# Patient Record
Sex: Male | Born: 1970 | Hispanic: No | Marital: Married | Smoking: Never smoker
Health system: Southern US, Community
[De-identification: ages and names within clinical notes are randomized; demographics above are authoritative.]

## PROBLEM LIST (undated history)

## (undated) DIAGNOSIS — N3010 Interstitial cystitis (chronic) without hematuria: Secondary | ICD-10-CM

## (undated) DIAGNOSIS — L4050 Arthropathic psoriasis, unspecified: Secondary | ICD-10-CM

## (undated) DIAGNOSIS — R51 Headache: Principal | ICD-10-CM

## (undated) HISTORY — PX: KNEE SURGERY: SHX244

## (undated) HISTORY — DX: Arthropathic psoriasis, unspecified: L40.50

## (undated) HISTORY — DX: Headache: R51

## (undated) HISTORY — DX: Interstitial cystitis (chronic) without hematuria: N30.10

---
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
## 1996-01-25 HISTORY — PX: CYSTOSCOPY: SUR368

## 2001-01-24 HISTORY — PX: WISDOM TOOTH EXTRACTION: SHX21

## 2006-11-20 ENCOUNTER — Encounter: Admission: RE | Admit: 2006-11-20 | Discharge: 2006-11-20 | Payer: Self-pay | Admitting: Endocrinology

## 2013-02-01 ENCOUNTER — Other Ambulatory Visit (HOSPITAL_COMMUNITY): Payer: Self-pay | Admitting: Gastroenterology

## 2013-02-01 DIAGNOSIS — R6881 Early satiety: Secondary | ICD-10-CM

## 2013-02-01 DIAGNOSIS — R110 Nausea: Secondary | ICD-10-CM

## 2013-02-08 ENCOUNTER — Encounter (HOSPITAL_COMMUNITY)
Admission: RE | Admit: 2013-02-08 | Discharge: 2013-02-08 | Disposition: A | Discharge status: Home / Self Care | Payer: 59 | Source: Ambulatory Visit | Attending: Gastroenterology | Admitting: Gastroenterology

## 2013-02-08 DIAGNOSIS — R110 Nausea: Secondary | ICD-10-CM | POA: Insufficient documentation

## 2013-02-08 DIAGNOSIS — R6881 Early satiety: Secondary | ICD-10-CM

## 2013-02-08 MED ORDER — TECHNETIUM TC 99M SULFUR COLLOID
2.0000 | Freq: Once | INTRAVENOUS | Status: AC | PRN
  Administered 2013-02-08: 2 via ORAL

## 2013-06-03 ENCOUNTER — Other Ambulatory Visit: Payer: Self-pay | Admitting: Gastroenterology

## 2013-06-03 DIAGNOSIS — R198 Other specified symptoms and signs involving the digestive system and abdomen: Secondary | ICD-10-CM

## 2013-06-03 DIAGNOSIS — R109 Unspecified abdominal pain: Secondary | ICD-10-CM

## 2013-06-05 ENCOUNTER — Other Ambulatory Visit: Payer: 59

## 2013-06-14 ENCOUNTER — Ambulatory Visit
Admission: RE | Admit: 2013-06-14 | Discharge: 2013-06-14 | Disposition: A | Discharge status: Home / Self Care | Payer: 59 | Source: Ambulatory Visit | Attending: Gastroenterology | Admitting: Gastroenterology

## 2013-06-14 ENCOUNTER — Other Ambulatory Visit: Payer: 59

## 2013-06-14 DIAGNOSIS — R198 Other specified symptoms and signs involving the digestive system and abdomen: Secondary | ICD-10-CM

## 2013-06-14 DIAGNOSIS — R109 Unspecified abdominal pain: Secondary | ICD-10-CM

## 2013-06-14 MED ORDER — IOHEXOL 300 MG/ML  SOLN
125.0000 mL | Freq: Once | INTRAMUSCULAR | Status: AC | PRN
  Administered 2013-06-14: 125 mL via INTRAVENOUS

## 2013-08-14 ENCOUNTER — Other Ambulatory Visit: Payer: Self-pay | Admitting: Family Medicine

## 2013-08-14 DIAGNOSIS — G44029 Chronic cluster headache, not intractable: Secondary | ICD-10-CM

## 2013-08-23 ENCOUNTER — Other Ambulatory Visit: Payer: 59

## 2013-08-30 ENCOUNTER — Ambulatory Visit
Admission: RE | Admit: 2013-08-30 | Discharge: 2013-08-30 | Disposition: A | Discharge status: Home / Self Care | Payer: 59 | Source: Ambulatory Visit | Attending: Family Medicine | Admitting: Family Medicine

## 2013-08-30 DIAGNOSIS — G44029 Chronic cluster headache, not intractable: Secondary | ICD-10-CM

## 2013-09-25 ENCOUNTER — Encounter: Payer: Self-pay | Admitting: Neurology

## 2013-09-25 ENCOUNTER — Ambulatory Visit (INDEPENDENT_AMBULATORY_CARE_PROVIDER_SITE_OTHER): Payer: 59 | Admitting: Neurology

## 2013-09-25 VITALS — BP 110/78 | HR 64 | Ht 73.0 in | Wt 191.0 lb

## 2013-09-25 DIAGNOSIS — K219 Gastro-esophageal reflux disease without esophagitis: Secondary | ICD-10-CM

## 2013-09-25 DIAGNOSIS — R51 Headache: Secondary | ICD-10-CM

## 2013-09-25 DIAGNOSIS — R519 Headache, unspecified: Secondary | ICD-10-CM | POA: Insufficient documentation

## 2013-09-25 HISTORY — DX: Headache: R51

## 2013-09-25 MED ORDER — VENLAFAXINE HCL ER 37.5 MG PO CP24: ORAL_CAPSULE | ORAL | Status: DC

## 2013-09-25 NOTE — Progress Notes (Signed)
Reason for visit: Headache  JESSIE SCHRIEBER is a 43 y.o. male  History of present illness:  Mr. Spillers is a 42 year old right-handed white male with a history of psoriasis and a psoriatic arthritis syndrome. The patient has been on Enbrel until about 10 days ago, and he was taken off the medication. This did significantly help his psoriatic arthritis. Beginning around March or April 2015, the patient developed a headache which was unusual for him. The patient indicates that prior to the onset of the headache, he will had traveled to [place], and during that trip he developed a viral illness with nausea and vomiting, fevers, and diarrhea that lasted 2 days and then fully resolved. The patient has not had any further symptoms in this regard. The patient has had some issues with a distortion of taste in the mouth, excessive saliva, and hoarseness of the voice. He has been seen through gastroenterology, but no etiology of his symptoms were noted. The patient has had daily headaches over the last several months, and the headaches are bifrontal in nature, may spread to the occipital area. The patient may have sharp jabbing pains behind eyes with pain in the eyelids as well. The headaches are daily in nature, better in the morning, and generally get worse around 5 or 6 PM when he gets home from work. The patient denies any visual blurring or loss of vision. He has been seen by an ophthalmologist and the eye examination was unremarkable. The patient does have some occasional neck stiffness and discomfort with the headache. The patient does not usually sleep well at night, but this is a chronic issue. He reports no numbness or weakness of the face, arms, or legs. He denies any photophobia or phonophobia with the headache and he has no nausea or vomiting with the headache. He has undergone a CT scan of the sinuses that were relatively unremarkable, but he was given a trial on clindamycin without benefit and a  nasal spray without benefit with the headache. The patient is not actively taking any medications for the headache when he headache comes on. The headaches are not disabling. He is sent to this office for an evaluation.  Past Medical History  Diagnosis Date  . Psoriatic arthritis   . Headache(784.0) 09/25/2013    Past Surgical History  Procedure Laterality Date  . Knee surgery Bilateral     Arthroscopic  . Arm surgery  1979  . Wisdom tooth extraction    . Orif arm fracture Left     Family History  Problem Relation Age of Onset  . Migraines Neg Hx     Social history:  reports that he has never smoked. He has never used smokeless tobacco. He reports that he drinks alcohol. He reports that he does not use illicit drugs.  Medications:  No current outpatient prescriptions on file prior to visit.   No current facility-administered medications on file prior to visit.     No Known Allergies  ROS:  Out of a complete 14 system review of symptoms, the patient complains only of the following symptoms, and all other reviewed systems are negative.  Eye pain Constipation Headache  Blood pressure 110/78, pulse 64, height  (1.854 m), weight 191 lb (86.637 kg).  Physical Exam  General: The patient is alert and cooperative at the time of the examination.  Eyes: Pupils are equal, round, and reactive to light. Discs are flat bilaterally.  Neck: The neck is supple, no carotid  bruits are noted.  Respiratory: The respiratory examination is clear.  Cardiovascular: The cardiovascular examination reveals a regular rate and rhythm, no obvious murmurs or rubs are noted.  Neuromuscular: Range of movement of the cervical spine is full. No crepitus is noted with the temporomandibular joints.  Skin: Extremities are without significant edema.  Neurologic Exam  Mental status: The patient is alert and oriented x 3 at the time of the examination. The patient has apparent normal recent and  remote memory, with an apparently normal attention span and concentration ability.  Cranial nerves: Facial symmetry is present. There is good sensation of the face to pinprick and soft touch bilaterally. The strength of the facial muscles and the muscles to head turning and shoulder shrug are normal bilaterally. Speech is well enunciated, no aphasia or dysarthria is noted. Extraocular movements are full. Visual fields are full. The tongue is midline, and the patient has symmetric elevation of the soft palate. No obvious hearing deficits are noted.  Motor: The motor testing reveals 5 over 5 strength of all 4 extremities. Good symmetric motor tone is noted throughout.  Sensory: Sensory testing is intact to pinprick, soft touch, vibration sensation, and position sense on all 4 extremities. No evidence of extinction is noted.  Coordination: Cerebellar testing reveals good finger-nose-finger and heel-to-shin bilaterally.  Gait and station: Gait is normal. Tandem gait is normal. Romberg is negative. No drift is seen.  Reflexes: Deep tendon reflexes are symmetric and normal bilaterally. Toes are downgoing bilaterally.   Assessment/Plan:  1. Headache, probable tension headache  2. Psoriasis  The patient appears to have a description of a headache syndrome most consistent with a muscle tension headache. The headaches are daily in nature, better in the morning and worse as the day goes on. The headaches are not disabling, but they are bothersome. The patient has been immunosuppressed with Enbrel, and he has traveled abroad recently. The patient will be set up for MRI evaluation of the brain. The patient requests a GI referral, I will get this set up. He could possibly have reflux symptoms resulting in the intermittent hoarseness of the voice that he reports. The patient was placed on Effexor for the headache. He will followup in 3 months. Clinical examination is normal today.  Marlan Palau  MD 09/25/2013 9:58 AM  [facility] [address] [place], [place] [address]  Phone [phone] Fax [phone]

## 2013-09-25 NOTE — Patient Instructions (Signed)

## 2013-10-02 ENCOUNTER — Ambulatory Visit (INDEPENDENT_AMBULATORY_CARE_PROVIDER_SITE_OTHER): Payer: 59

## 2013-10-02 DIAGNOSIS — R51 Headache: Secondary | ICD-10-CM

## 2013-10-03 ENCOUNTER — Encounter: Payer: Self-pay | Admitting: Internal Medicine

## 2013-10-03 ENCOUNTER — Telehealth: Payer: Self-pay | Admitting: Neurology

## 2013-10-03 NOTE — Telephone Encounter (Signed)
  I called patient. The MRI the brain is unremarkable. The patient has had some improvement in his headaches as his constipation issue has alleviated. He will go up on the Effexor taking 75 mg daily.  MRI brain 10/03/2013:  Impression   Normal MRI brain (without). No change from MRI on 08/30/13.

## 2013-11-14 ENCOUNTER — Telehealth: Payer: Self-pay | Admitting: Internal Medicine

## 2013-11-14 NOTE — Telephone Encounter (Signed)
Rec'd from [facility] forward 26 pages to Dr. Leone PayorGessner

## 2013-12-02 ENCOUNTER — Ambulatory Visit: Payer: 59 | Admitting: Internal Medicine

## 2013-12-03 ENCOUNTER — Encounter: Payer: Self-pay | Admitting: Internal Medicine

## 2014-01-21 ENCOUNTER — Telehealth: Payer: Self-pay | Admitting: Internal Medicine

## 2014-01-21 NOTE — Telephone Encounter (Signed)
[facility] and placed on Dr. Marvell FullerGessner's Desk to be reviewed. Called pt and made him aware we canceled his appt on 02-03-14 until records are reviewed

## 2014-02-03 ENCOUNTER — Ambulatory Visit: Payer: 59 | Admitting: Internal Medicine

## 2014-02-03 ENCOUNTER — Telehealth: Payer: Self-pay | Admitting: Neurology

## 2014-02-03 NOTE — Telephone Encounter (Signed)
Left message relaying that I need to cancel his appointment for 02-05-14 due to provider not being available.  I asked that he call back to reschedule his follow up appointment.

## 2014-02-04 NOTE — Telephone Encounter (Signed)
Left message on patient's home phone relaying cancellation of appointment on 02-05-14 and asked that he return call to confirm.

## 2014-02-05 ENCOUNTER — Ambulatory Visit: Payer: 59 | Admitting: Neurology

## 2016-02-06 IMAGING — MR MR HEAD W/O CM
9 series · 41 of 48 positions shown · non-contrast
Comparison: None available.

CLINICAL DATA: Cluster headaches, predominantly on the left.

EXAM:
MRI HEAD WITHOUT CONTRAST
TECHNIQUE: Multiplanar, multiecho pulse sequences of the brain and surrounding
structures were obtained without intravenous contrast.

[Series 2: T1 · sagittal · 5.0mm · 0.45mm/px · 2 of 21 slices shown]
[im 1/21]
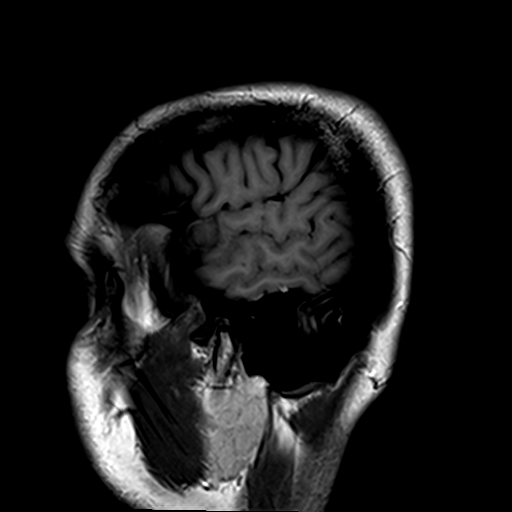
[im 21/21]
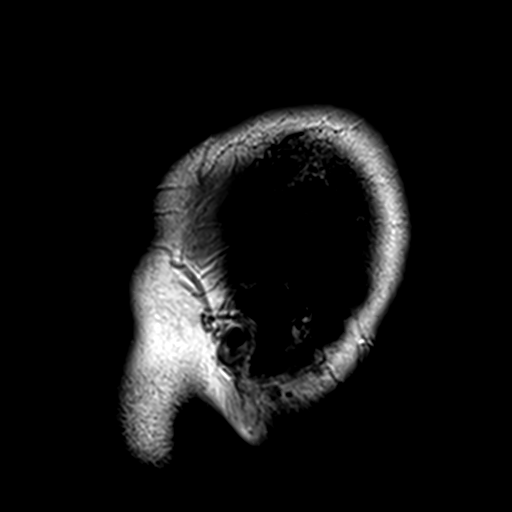

[Series 3: DWI · axial · 5.0mm · 1.80mm/px · z∈[-32,+123]mm · 5 of 48 slices shown (1 of 2)]
[im 1/48]
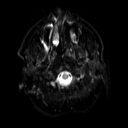
[im 12/48]
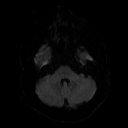
[im 24/48]
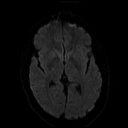
[im 36/48]
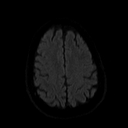
[im 48/48]
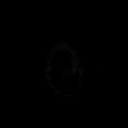

[Series 4: DWI · axial · 5.0mm · 1.80mm/px · z∈[-32,+123]mm · 3 of 23 slices shown (2 of 2)]
[im 1/23]
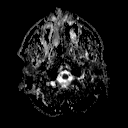
[im 12/23]
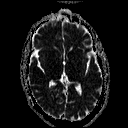
[im 23/23]
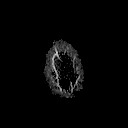

[Series 5: mip_images(sw) · axial · 16.0mm · 0.90mm/px · z∈[-26,+118]mm · 8 of 73 slices shown]
[im 1/73]
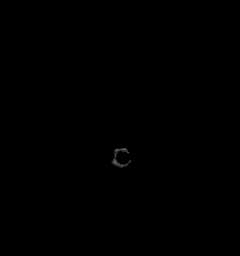
[im 10/73]
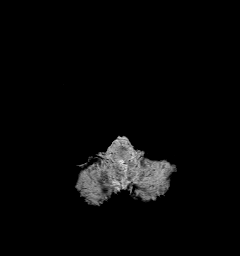
[im 19/73]
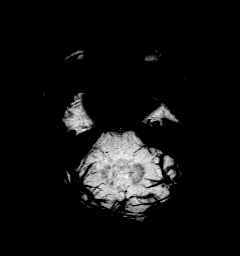
[im 28/73]
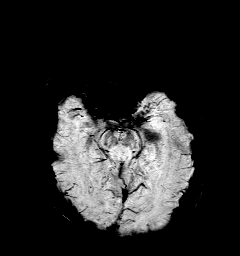
[im 46/73]
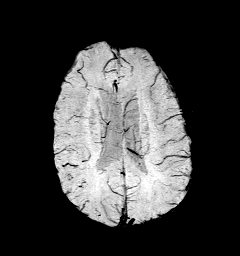
[im 55/73]
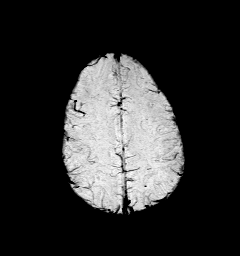
[im 64/73]
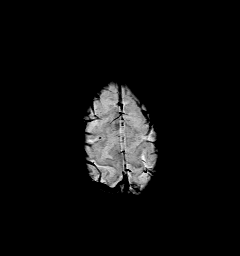
[im 73/73]
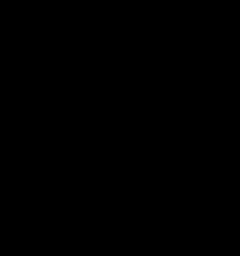

[Series 6: swi_images · axial · 2.0mm · 0.90mm/px · z∈[-33,+125]mm · 8 of 80 slices shown]
[im 1/80]
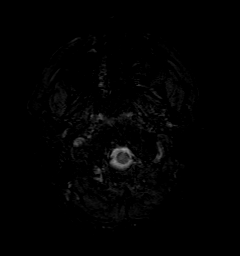
[im 9/80]
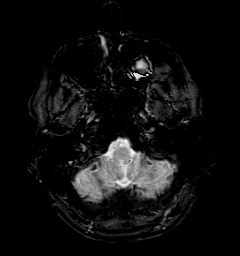
[im 27/80]
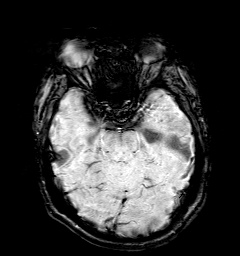
[im 36/80]
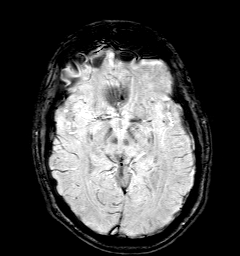
[im 44/80]
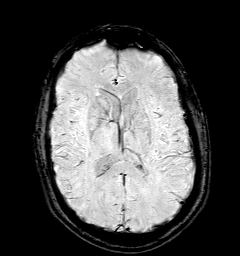
[im 53/80]
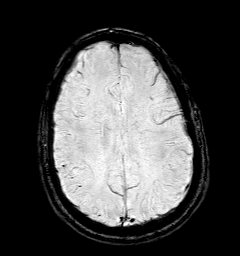
[im 71/80]
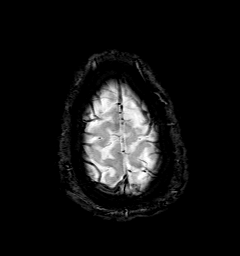
[im 80/80]
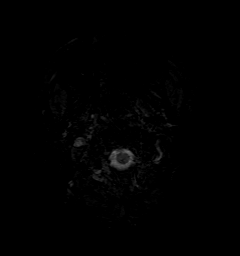

[Series 7: T2 · axial · 5.0mm · 0.51mm/px · z∈[-26,+129]mm · 3 of 24 slices shown (1 of 2)]
[im 1/24]
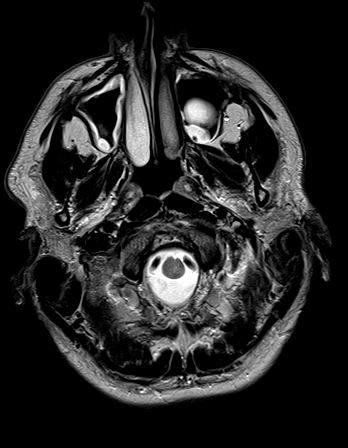
[im 12/24]
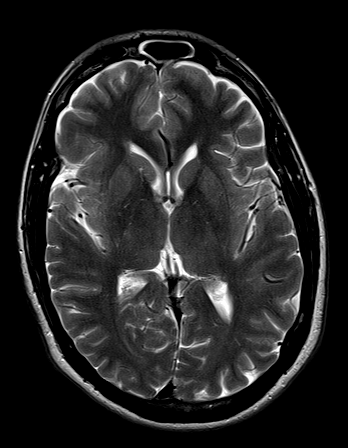
[im 24/24]
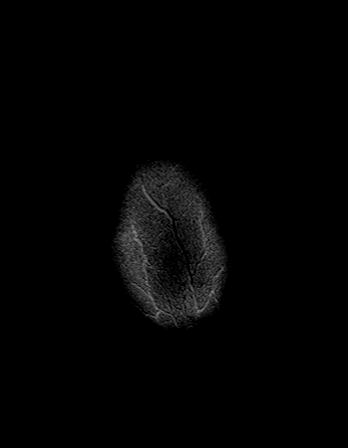

[Series 8: FLAIR · axial · 5.0mm · 0.45mm/px · z∈[-27,+128]mm · 3 of 24 slices shown]
[im 1/24]
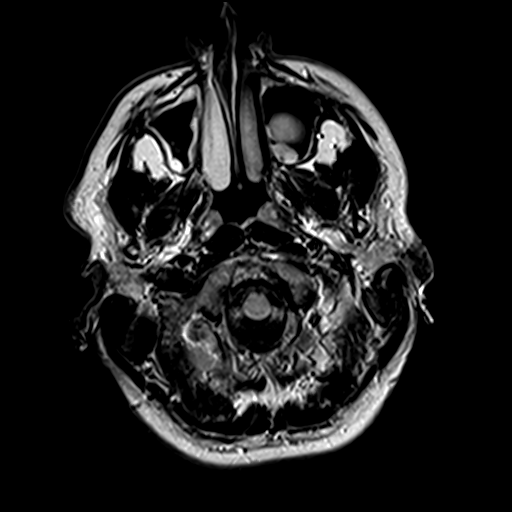
[im 12/24]
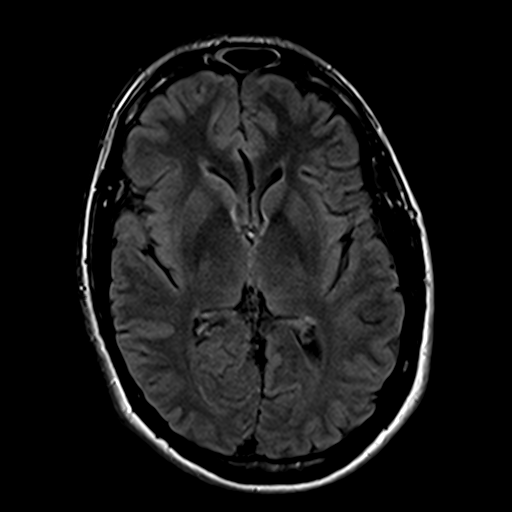
[im 24/24]
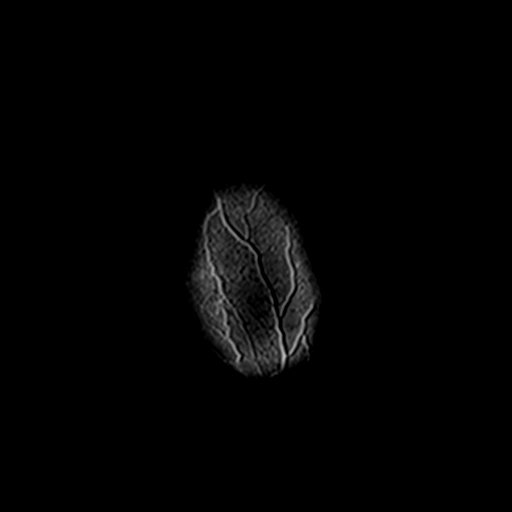

[Series 9: t1_mpr_tra · axial · 2.0mm · 0.45mm/px · z∈[-28,+76]mm · 6 of 80 slices shown]
[im 1/80]
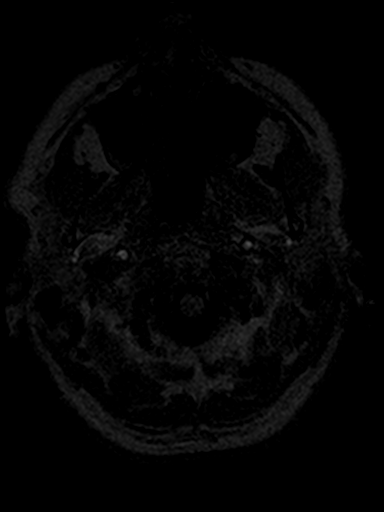
[im 9/80]
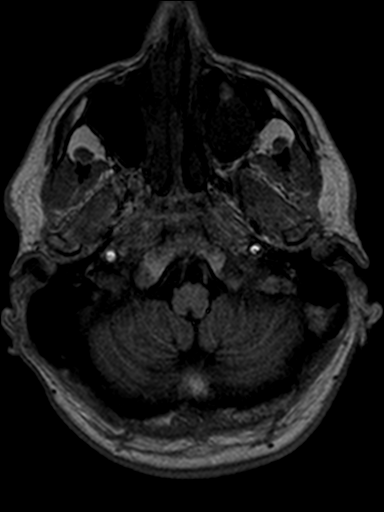
[im 27/80]
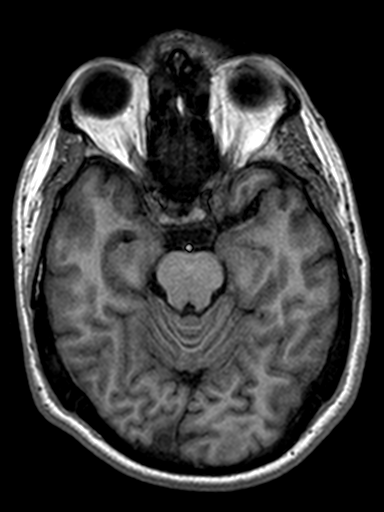
[im 36/80]
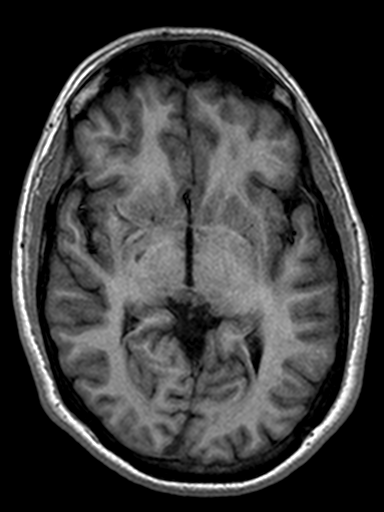
[im 44/80]
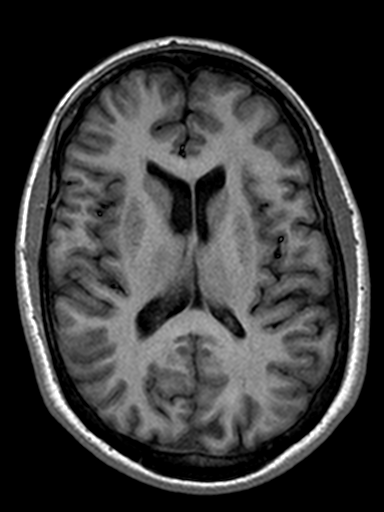
[im 53/80]
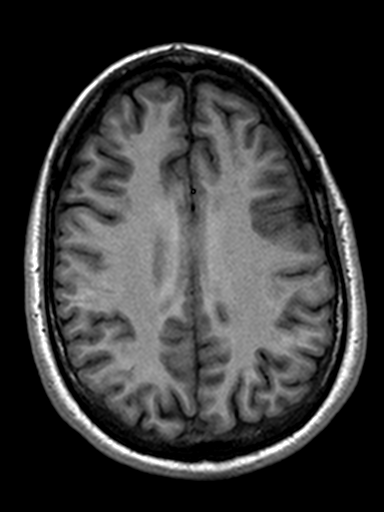

[Series 10: T2 · coronal · 5.0mm · 0.45mm/px · 3 of 29 slices shown (2 of 2)]
[im 1/29]
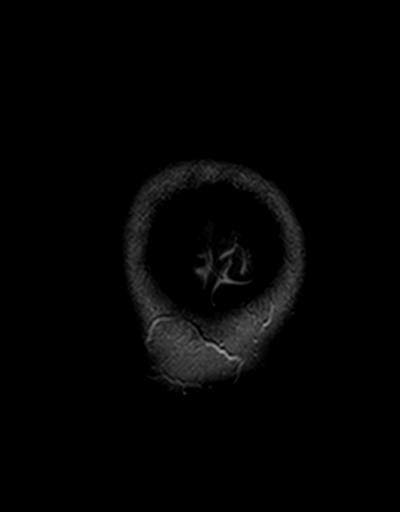
[im 15/29]
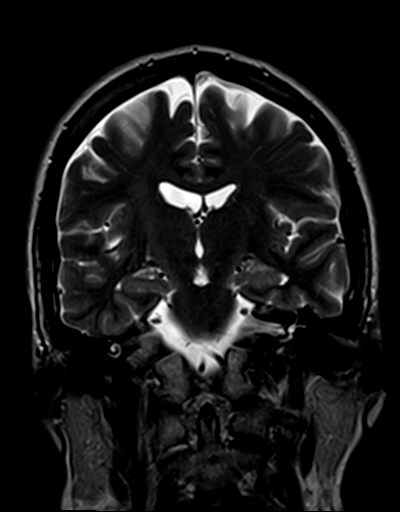
[im 29/29]
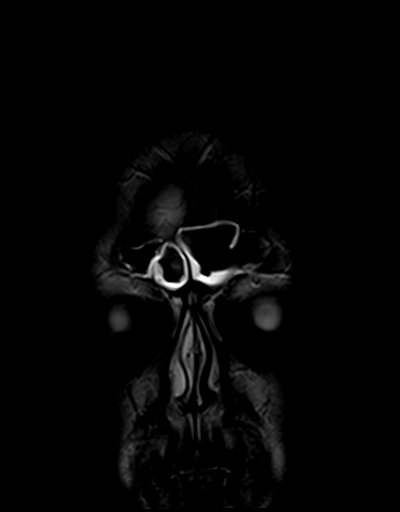

[41 of 48 positions shown; findings below may reference images not displayed]

FINDINGS: No acute infarct, hemorrhage, or mass lesion is present. The
ventricles are of normal size. No significant extraaxial fluid
collection is present.

No significant white matter changes are present. Flow is present in
the major intracranial arteries. Globes and orbits are intact.

A prominent polyp or mucous retention cyst is noted in the left
maxillary sinus. Circumferential mucosal thickening is present in
the maxillary sinuses, anterior ethmoid air cells, and particularly
in the frontal sinuses bilaterally. There are no significant fluid
levels. The sphenoid sinuses are clear. Minimal fluid is present in
the mastoid air cells bilaterally. No obstructing at nasopharyngeal
lesions are evident.
IMPRESSION: 1. Normal MRI appearance of the brain.
2. Anterior sinus disease as described.

## 2016-10-13 NOTE — Telephone Encounter (Signed)
Patient has never returned phone call. Records will be shredded. °
